# Patient Record
Sex: Female | Born: 1955 | Race: White | Hispanic: No
Health system: Southern US, Community
[De-identification: ages and names within clinical notes are randomized; demographics above are authoritative.]

---

## 2017-07-25 ENCOUNTER — Telehealth (HOSPITAL_COMMUNITY): Payer: Self-pay

## 2017-08-09 ENCOUNTER — Encounter: Payer: Self-pay | Admitting: Obstetrics & Gynecology

## 2017-09-08 ENCOUNTER — Emergency Department (HOSPITAL_COMMUNITY)
Admission: EM | Admit: 2017-09-08 | Discharge: 2017-09-08 | Disposition: A | Discharge status: Home / Self Care | Payer: Medicare Other | Attending: Emergency Medicine | Admitting: Emergency Medicine

## 2017-09-08 ENCOUNTER — Encounter (HOSPITAL_COMMUNITY): Payer: Self-pay

## 2017-09-08 DIAGNOSIS — Z5321 Procedure and treatment not carried out due to patient leaving prior to being seen by health care provider: Secondary | ICD-10-CM | POA: Insufficient documentation

## 2017-09-08 DIAGNOSIS — M791 Myalgia, unspecified site: Secondary | ICD-10-CM | POA: Diagnosis present

## 2017-09-08 LAB — URINALYSIS, ROUTINE W REFLEX MICROSCOPIC
Bilirubin Urine: NEGATIVE
Glucose, UA: NEGATIVE mg/dL
Ketones, ur: NEGATIVE mg/dL
NITRITE: NEGATIVE
Protein, ur: NEGATIVE mg/dL
SPECIFIC GRAVITY, URINE: 1.005 (ref 1.005–1.030)
pH: 6 (ref 5.0–8.0)

## 2017-09-08 LAB — COMPREHENSIVE METABOLIC PANEL
ALK PHOS: 64 U/L (ref 38–126)
ALT: 15 U/L (ref 14–54)
ANION GAP: 11 (ref 5–15)
AST: 24 U/L (ref 15–41)
Albumin: 3.8 g/dL (ref 3.5–5.0)
BILIRUBIN TOTAL: 0.6 mg/dL (ref 0.3–1.2)
BUN: 11 mg/dL (ref 6–20)
CO2: 22 mmol/L (ref 22–32)
Calcium: 9.2 mg/dL (ref 8.9–10.3)
Chloride: 105 mmol/L (ref 101–111)
Creatinine, Ser: 0.88 mg/dL (ref 0.44–1.00)
GLUCOSE: 91 mg/dL (ref 65–99)
POTASSIUM: 3.5 mmol/L (ref 3.5–5.1)
Sodium: 138 mmol/L (ref 135–145)
TOTAL PROTEIN: 7.2 g/dL (ref 6.5–8.1)

## 2017-09-08 LAB — CBC
HEMATOCRIT: 39.2 % (ref 36.0–46.0)
HEMOGLOBIN: 13.2 g/dL (ref 12.0–15.0)
MCH: 30.5 pg (ref 26.0–34.0)
MCHC: 33.7 g/dL (ref 30.0–36.0)
MCV: 90.5 fL (ref 78.0–100.0)
Platelets: 287 10*3/uL (ref 150–400)
RBC: 4.33 MIL/uL (ref 3.87–5.11)
RDW: 14.9 % (ref 11.5–15.5)
WBC: 7.4 10*3/uL (ref 4.0–10.5)

## 2017-09-08 LAB — LIPASE, BLOOD: Lipase: 23 U/L (ref 11–51)

## 2017-09-08 NOTE — ED Notes (Signed)
Pt called for recheck v/s, no response from lobby 

## 2017-09-08 NOTE — ED Notes (Signed)
Pt called for room/recheck v/s, no response from lobby

## 2017-09-08 NOTE — ED Triage Notes (Addendum)
Pt BIB [facility] c/o generalized pain and fatigue. Hx of fibromyalgia and she reports multiple personalities. EMS reports that pt states that she recently left AMA from [place]. A&Ox4. Pt can take a few steps, but then states that she's too weak to continue.

## 2017-09-12 ENCOUNTER — Emergency Department
Admission: EM | Admit: 2017-09-12 | Discharge: 2017-09-12 | Disposition: A | Discharge status: Home / Self Care | Payer: Medicare Other | Attending: Emergency Medicine | Admitting: Emergency Medicine

## 2017-09-12 ENCOUNTER — Emergency Department: Payer: Medicare Other

## 2017-09-12 ENCOUNTER — Encounter: Payer: Self-pay | Admitting: *Deleted

## 2017-09-12 DIAGNOSIS — R45851 Suicidal ideations: Secondary | ICD-10-CM | POA: Diagnosis not present

## 2017-09-12 DIAGNOSIS — M545 Low back pain: Secondary | ICD-10-CM | POA: Insufficient documentation

## 2017-09-12 DIAGNOSIS — G8929 Other chronic pain: Secondary | ICD-10-CM

## 2017-09-12 MED ORDER — CLONAZEPAM 0.5 MG PO TABS: 0.5000 mg | ORAL_TABLET | Freq: Two times a day (BID) | ORAL | 0 refills | Status: AC

## 2017-09-12 MED ORDER — LIDOCAINE 5 % EX PTCH: 1.0000 | MEDICATED_PATCH | Freq: Two times a day (BID) | CUTANEOUS | 0 refills | Status: AC

## 2017-09-12 MED ORDER — MELOXICAM 7.5 MG PO TABS: 7.5000 mg | ORAL_TABLET | Freq: Every day | ORAL | 0 refills | Status: AC

## 2017-09-12 MED ORDER — LORAZEPAM 1 MG PO TABS
1.0000 mg | ORAL_TABLET | Freq: Once | ORAL | Status: AC
  Administered 2017-09-12: 1 mg via ORAL
  Filled 2017-09-12: qty 1

## 2017-09-12 NOTE — ED Provider Notes (Signed)
Patient received in signout from PA Nona Dellonald Smith.  Patient presented to the ER for "years of chronic low back pain "some point during the interview the patient stated that "she wishes God would take her away.  "This was interpreted by staff as the patient being suicidal therefore she was brought over to the psych evaluation section of the ER.  Patient states that she would "never hurt herself, because she is in God's hands.  "States that she has never had any previous history of suicide.  States that she recently moved to the area and has a new boyfriend that she enjoys spending time with.  She made that standpoints to the staff simply stating that she has been dealing with chronic pain for quite some time and sometimes feels that up with dealing with the pain but would never take her own life.  She did recently come off of her chronic Klonopin medication roughly 1 month ago and has not been able to get back to see her primary doctor for a refill.  She does not have any evidence of active withdrawal.  She has no active SI or HI.  She appears to have intact thought processes at this time I do feel that she is stable and appropriate for further workup as an outpatient.   Willy Eddyobinson, Jakia Kennebrew, MD 09/12/17 [phone]

## 2017-09-12 NOTE — ED Notes (Addendum)
Pt states [place], was [facility] by EMS, waited 8 hours and wasn't seen, so she called a taxi and left. Returned to the ER Wednesday PM, pt left a second time because she "was treated so discourtesly" and left AMA. Pt reports then traveling out of town to stay with a friend and slipped while "climbing down a mountain." Pt called EMS again Friday, was brought [facility] and left AMA again. Story very unclear at this time. Pt continues story and timeline changes again. Per pt, EMS advised her "not to go to the ER" because they were "all full."

## 2017-09-12 NOTE — ED Provider Notes (Signed)
[facility] Emergency Department Provider Note  ____________________________________________   First MD Initiated Contact with Patient 09/12/17 1345     (approximate)  I have reviewed the triage vital signs and the nursing notes.   HISTORY  Chief Complaint Back Pain    HPI Erica Francis is a 61 y.o. female patient complaining of back pain secondary to multiple falls when she cannot locate her walker. These falls occurred last week and the patient went to by my account 3 different emergency room and left due to prolonged wait time.Patient denies radicular component to her back pain. Patient denies bladder or bowel dysfunction. Patient rates the pain as a constant for over 10 and increases to a 6/10 with ambulation. No palliative measures for complaint. Patient also states she is suicidal and self-destructive ideology and requests evaluation and treatment. Patient also requests refill of her anxiety medicine.   History reviewed. No pertinent past medical history.  There are no active problems to display for this patient.   History reviewed. No pertinent surgical history.  Prior to Admission medications   Not on File    Allergies Patient has no known allergies.  History reviewed. No pertinent family history. Social History Social History  Substance Use Topics  . Smoking status: Not on file  . Smokeless tobacco: Not on file  . Alcohol use Not on file    Review of Systems Constitutional: No fever/chills Eyes: No visual changes. ENT: No sore throat. Cardiovascular: Denies chest pain. Respiratory: Denies shortness of breath. Gastrointestinal: No abdominal pain.  No nausea, no vomiting.  No diarrhea.  No constipation. Genitourinary: Negative for dysuria. Musculoskeletal: Back pain . Skin: Negative for rash. Neurological: Negative for headaches, focal weakness or numbness. Psychiatric:Suicidal  ideations ____________________________________________   PHYSICAL EXAM:  VITAL SIGNS: ED Triage Vitals [09/12/17 1300]  Enc Vitals Group     BP      Pulse      Resp      Temp      Temp src      SpO2      Weight 190 lb (86.2 kg)     Height 5' (1.524 m)     Head Circumference      Peak Flow      Pain Score      Pain Loc      Pain Edu?      Excl. in GC?    Constitutional: Alert and oriented. Well appearing and in no acute distress. Eyes: Conjunctivae are normal. PERRL. EOMI. Head: Atraumatic. Nose: No congestion/rhinnorhea. Mouth/Throat: Mucous membranes are moist.  Oropharynx non-erythematous. Neck: No stridor.  No cervical spine tenderness to palpation. Hematological/Lymphatic/Immunilogical: No cervical lymphadenopathy. Cardiovascular: Normal rate, regular rhythm. Grossly normal heart sounds.  Good peripheral circulation. Respiratory: Normal respiratory effort.  No retractions. Lungs CTAB. Gastrointestinal: Soft and nontender. No distention. No abdominal bruits. No CVA tenderness. Musculoskeletal: No obvious spinal deformity. Patient moderate guarding palpation of L4-S1. Patient has negative straight leg test. Patient no extremity strength is 5 over 5. Neurologic:  Normal speech and language. No gross focal neurologic deficits are appreciated. No gait instability. Skin:  Skin is warm, dry and intact. No rash noted. Psychiatric: Patient has flat affect. ____________________________________________   LABS (all labs ordered are listed, but only abnormal results are displayed)  Labs Reviewed - No data to display ____________________________________________  EKG   ____________________________________________  RADIOLOGY  Dg Lumbar Spine Complete  Result Date: 09/12/2017 CLINICAL DATA:  Low back pain and spasm since  falling last week. EXAM: LUMBAR SPINE - COMPLETE 4+ VIEW COMPARISON:  None in PACs FINDINGS: The lumbar vertebral bodies are preserved in height. The L1  transverse processes are transitional. The disc space heights are well maintained with exception of L5-S1. There is grade 1 anterolisthesis of L5 with respect to S1 likely on the basis of degenerative disc and facet joint change. No definite pars defects are observed. I cannot exclude partial compression of T12. IMPRESSION: Grade 1 anterolisthesis of L5 with respect S1 likely on the basis of degenerative disc disease. No definite pars defects are observed. Possible partial compression of the body of T12. A thoracic spine series would be a useful next imaging step. Electronically Signed   By: David  [place] M.D.   On: 09/12/2017 14:58    ____________________________________________   PROCEDURES  Procedure(s) performed: None  Procedures  Critical Care performed: No  ____________________________________________   INITIAL IMPRESSION / ASSESSMENT AND PLAN / ED COURSE  As part of my medical decision making, I reviewed the following data within the electronic MEDICAL RECORD NUMBER Notes from prior ED visits and [place] Controlled Substance Database   Patient's present for back pain and suicidal ideations. X-ray of the back reveals only degenerative changes. Discussed x-ray finding with patient. Patient is medically cleared for psychiatric evaluation by supervising physician.      ____________________________________________   FINAL CLINICAL IMPRESSION(S) / ED DIAGNOSES  Final diagnoses:  Chronic low back pain without sciatica, unspecified back pain laterality  Passive suicidal ideations      NEW MEDICATIONS STARTED DURING THIS VISIT:  New Prescriptions   No medications on file     Note:  This document was prepared using Dragon voice recognition software and may include unintentional dictation errors.    Joni Reining, PA-C 09/12/17 1524    Emily Filbert, MD 09/12/17 [phone]

## 2017-09-12 NOTE — ED Triage Notes (Signed)
States last Wednesday she could not find her walker and she fell, states lower back pain and spasms, denies hitting her head and denies any LOC, ambulatory with walker at present

## 2017-09-12 NOTE — ED Notes (Signed)
Pt dressed out in purple scrubs and belonging secured - wanded by security

## 2017-09-12 NOTE — ED Notes (Signed)
Pt states that she is only here for pain management and that she does not understand why God would make her suffer in so much pain for so many years - she denies SI or HI at this time and states that she would not ever hurt herself

## 2017-09-12 NOTE — ED Notes (Signed)
Pt being dressed back into street clothes

## 2017-09-12 NOTE — Discharge Instructions (Addendum)
Patient is m.dcback edically cleared to be transferred for psychological evaluation due to her expressed desire.

## 2017-10-19 ENCOUNTER — Ambulatory Visit: Payer: Self-pay | Admitting: Nurse Practitioner

## 2017-11-09 ENCOUNTER — Telehealth: Payer: Self-pay | Admitting: Nurse Practitioner

## 2017-11-09 NOTE — Telephone Encounter (Signed)
Called pt to sched for AWV with Nurse Health Advisor.  awv - I C/b #  [phone] on Skype [email or web address] if you have questions

## 2018-09-04 IMAGING — CR DG LUMBAR SPINE COMPLETE 4+V
1 series · 5 of 5 positions shown · non-contrast
Comparison: None in PACs

CLINICAL DATA: Low back pain and spasm since falling last week.

EXAM:
LUMBAR SPINE - COMPLETE 4+ VIEW

[Series 1: dg lumbar spine complete 4 +v · 0.14mm/px · 5 of 5 slices shown]
[im 1/5]
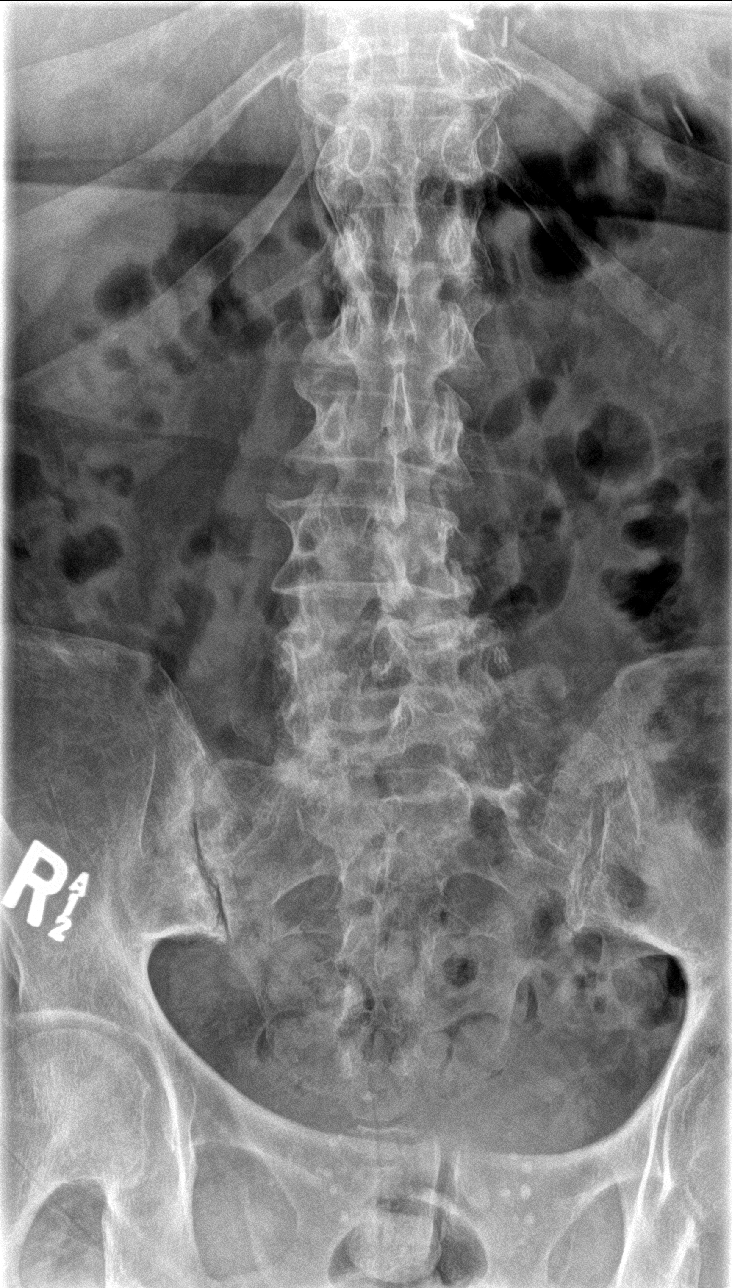
[im 2/5]
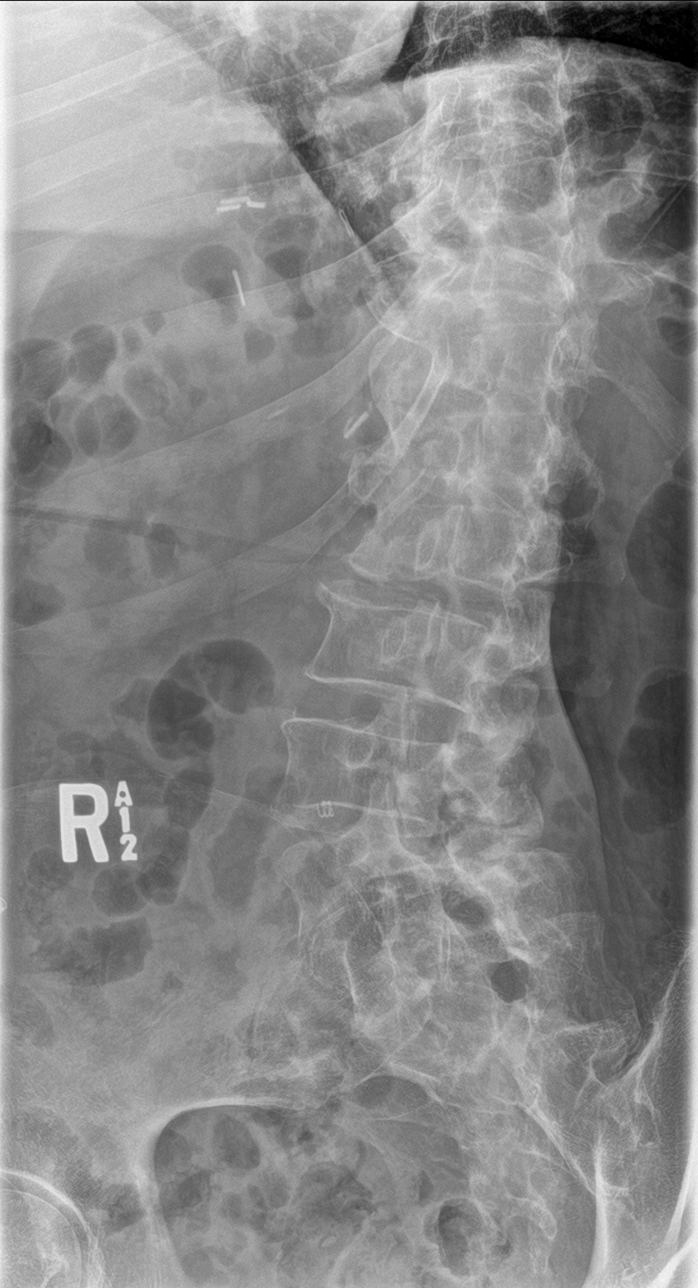
[im 3/5]
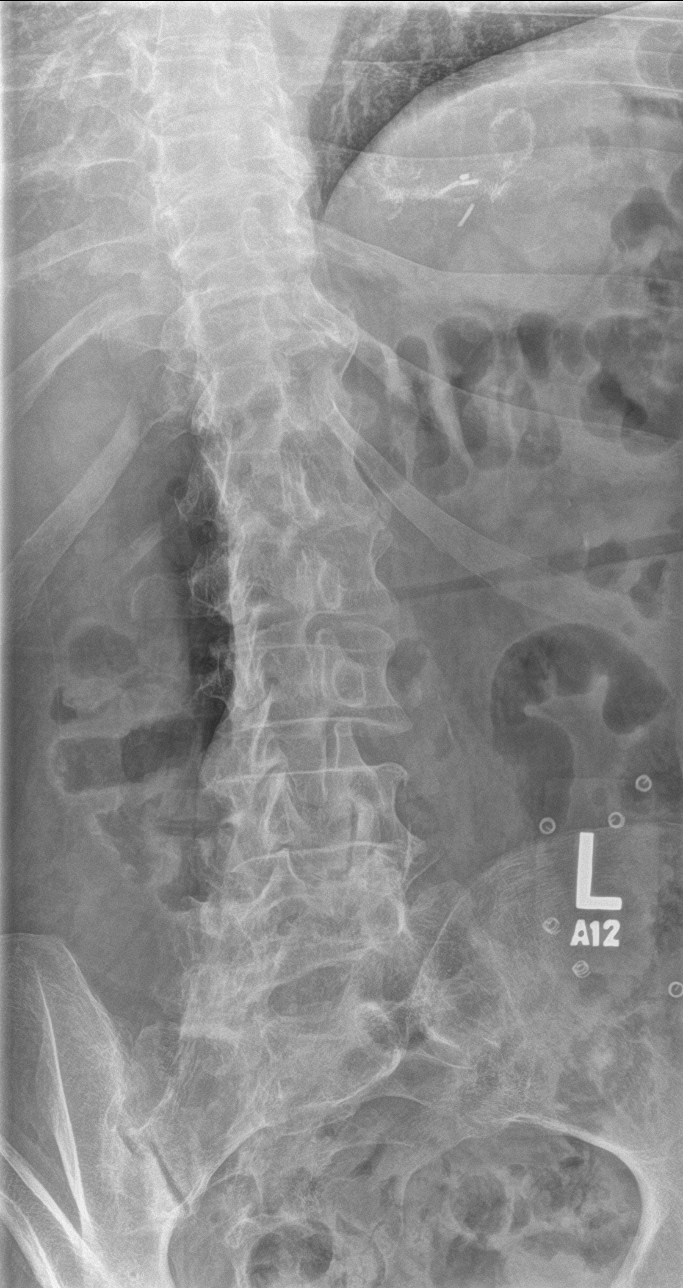
[im 4/5]
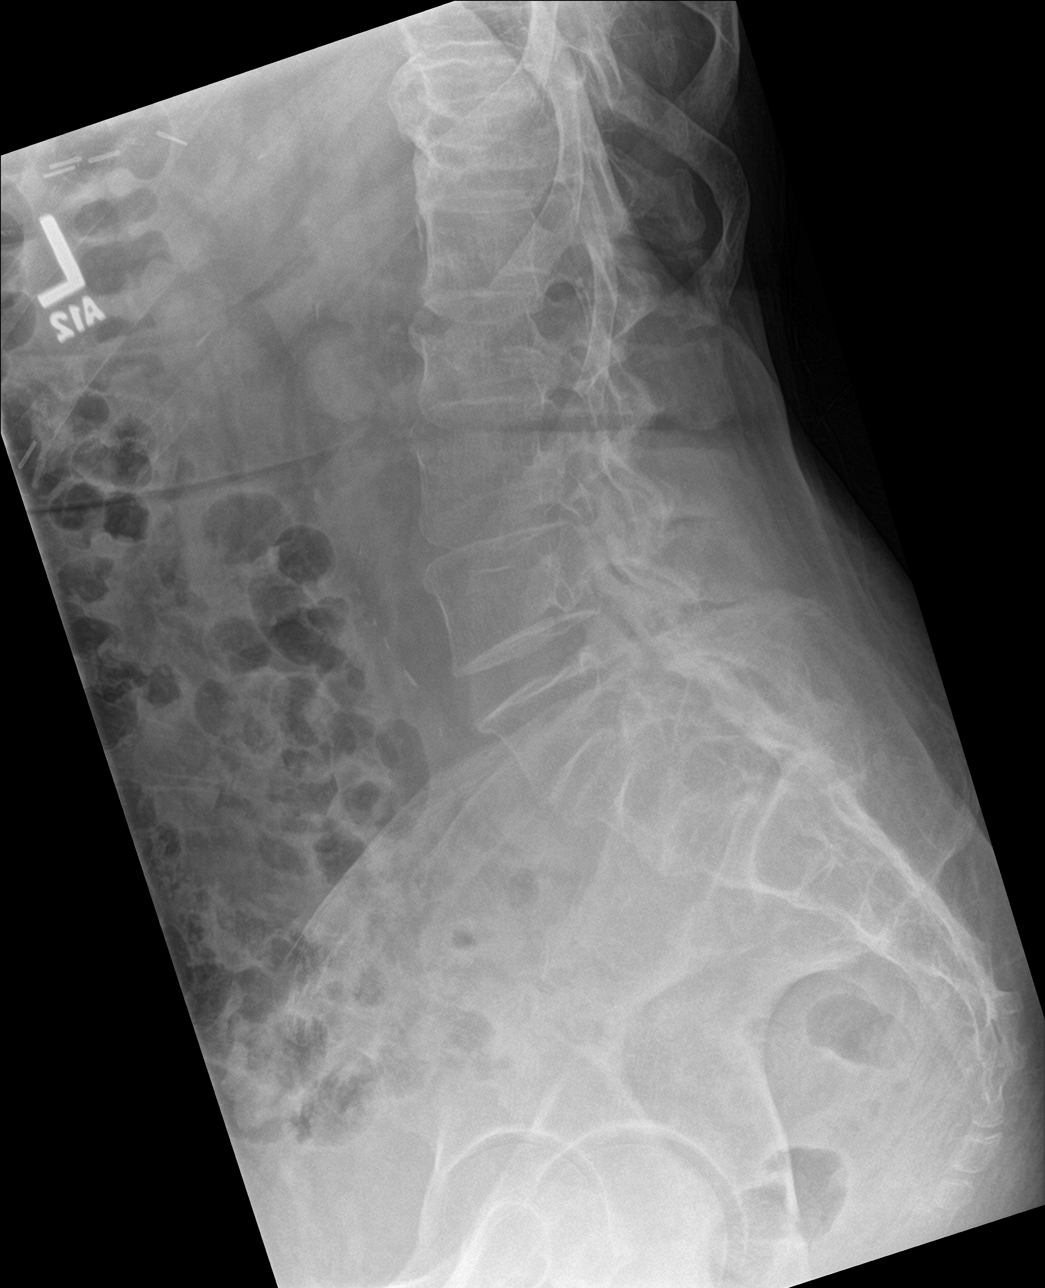
[im 5/5]
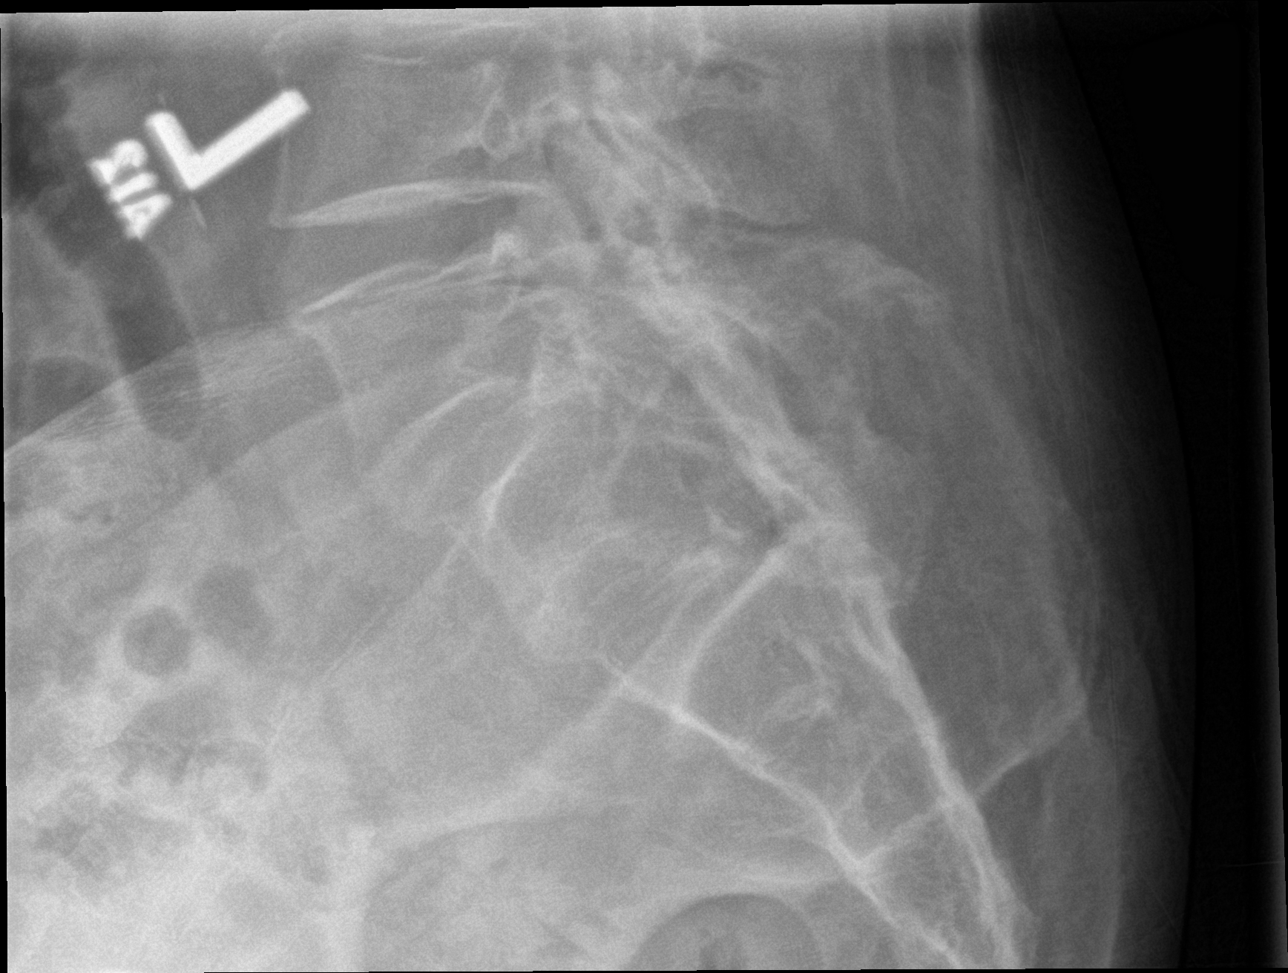

[5 of 5 positions shown; findings below may reference images not displayed]

FINDINGS: The lumbar vertebral bodies are preserved in height. The L1
transverse processes are transitional. The disc space heights are
well maintained with exception of L5-S1. There is grade 1
anterolisthesis of L5 with respect to S1 likely on the basis of
degenerative disc and facet joint change. No definite pars defects
are observed. I cannot exclude partial compression of T12.
IMPRESSION: Grade 1 anterolisthesis of L5 with respect S1 likely on the basis of
degenerative disc disease. No definite pars defects are observed.

Possible partial compression of the body of T12. A thoracic spine
series would be a useful next imaging step.
